# Patient Record
Sex: Male | Born: 2005 | Race: White | Hispanic: No | Marital: Single | Smoking: Never smoker
Health system: Southern US, Community
[De-identification: ages and names within clinical notes are randomized; demographics above are authoritative.]

## PROBLEM LIST (undated history)

## (undated) DIAGNOSIS — S42302A Unspecified fracture of shaft of humerus, left arm, initial encounter for closed fracture: Secondary | ICD-10-CM

## (undated) HISTORY — PX: MYRINGOTOMY: SUR874

## (undated) HISTORY — PX: DENTAL SURGERY: SHX609

---
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
## 2023-09-15 ENCOUNTER — Ambulatory Visit (HOSPITAL_BASED_OUTPATIENT_CLINIC_OR_DEPARTMENT_OTHER): Payer: No Typology Code available for payment source | Admitting: Student

## 2023-09-15 ENCOUNTER — Encounter (HOSPITAL_BASED_OUTPATIENT_CLINIC_OR_DEPARTMENT_OTHER): Payer: Self-pay | Admitting: Student

## 2023-09-15 ENCOUNTER — Ambulatory Visit (HOSPITAL_BASED_OUTPATIENT_CLINIC_OR_DEPARTMENT_OTHER): Payer: No Typology Code available for payment source

## 2023-09-15 DIAGNOSIS — S93491A Sprain of other ligament of right ankle, initial encounter: Secondary | ICD-10-CM

## 2023-09-15 DIAGNOSIS — M25571 Pain in right ankle and joints of right foot: Secondary | ICD-10-CM | POA: Diagnosis not present

## 2023-09-15 NOTE — Progress Notes (Signed)
Chief Complaint: Right ankle pain     History of Present Illness:    Edward Fuentes is a 17 y.o. male presenting today for evaluation of pain in his right ankle.  He states he was playing basketball yesterday when he came down on a wrong and felt a pop.  He was wearing Birkenstocks at the time of the injury.  Pain today is located mainly over the lateral side of the foot and ankle but he does have some pain medially as well.  He has tried ibuprofen and icing.  He is able to weight-bear.  He does have a history of an ankle sprain in 2021 also from a basketball injury.   Surgical History:   None  PMH/PSH/Family History/Social History/Meds/Allergies:    Past Medical History:  Diagnosis Date   Arm fracture, left    Past Surgical History:  Procedure Laterality Date   DENTAL SURGERY     MYRINGOTOMY     Social History   Socioeconomic History   Marital status: Single    Spouse name: Not on file   Number of children: Not on file   Years of education: Not on file   Highest education level: Not on file  Occupational History   Not on file  Tobacco Use   Smoking status: Never   Smokeless tobacco: Never  Substance and Sexual Activity   Alcohol use: Not on file   Drug use: No   Sexual activity: Not on file  Other Topics Concern   Not on file  Social History Narrative   Not on file   Social Determinants of Health   Financial Resource Strain: Not on file  Food Insecurity: Not on file  Transportation Needs: Not on file  Physical Activity: Not on file  Stress: Not on file  Social Connections: Unknown (04/16/2022)   Received from [facility]   Social Network    Social Network: Not on file   History reviewed. No pertinent family history. No Known Allergies Current Outpatient Medications  Medication Sig Dispense Refill   albuterol (VENTOLIN HFA) 108 (90 Base) MCG/ACT inhaler Inhale into the lungs.     No current facility-administered  medications for this visit.   No results found.  Review of Systems:   A ROS was performed including pertinent positives and negatives as documented in the HPI.  Physical Exam :   Constitutional: NAD and appears stated age Neurological: Alert and oriented Psych: Appropriate affect and cooperative There were no vitals taken for this visit.   Comprehensive Musculoskeletal Exam:    Moderate swelling noted of the right ankle.  Tenderness palpation over the ATFL, CF ligament, and distal fibula.  More mild tenderness over the deltoid ligament of the medial ankle.  Active ROM to 30 degrees plantarflexion 20 degrees dorsiflexion.  Positive anterior drawer test.  Pain with passive inversion.  DP pulse 2+.  Neurosensory exam intact.  Imaging:   Xray (right foot 3 views, right ankle 3 views): Soft tissue edema of the ankle.  No evidence of acute fracture or dislocation.   I personally reviewed and interpreted the radiographs.   Assessment:   17 y.o. male with right ankle pain and swelling consistent with a moderate grade ankle sprain.  Does have previous evidence of a significant ankle sprain 3 years ago with tearing of  the ATFL and CF ligaments on MRI.  I have recommended initial treatment with RICE therapy, NSAIDs, and bracing.  I have provided a lace up ankle brace today.  Will continue to monitor his symptoms particularly for any residual ankle instability.  Patient is understanding that should the symptoms persist, would like to see him back for further assessment and workup.  Plan :    -Ankle bracing and conservative treatments for right ankle sprain -Return to clinic as needed     I personally saw and evaluated the patient, and participated in the management and treatment plan.  Hazle Nordmann, PA-C Orthopedics
